# Patient Record
Sex: Female | Born: 1950 | Race: Black or African American | Hispanic: No | Marital: Single
Health system: Midwestern US, Academic
[De-identification: ages and names within clinical notes are randomized; demographics above are authoritative.]

---

## 2018-01-03 ENCOUNTER — Ambulatory Visit: Admit: 2018-01-03 | Discharge: 2018-01-04 | Payer: Medicare Other

## 2018-01-03 ENCOUNTER — Encounter: Admit: 2018-01-03 | Discharge: 2018-01-03 | Payer: Medicare Other

## 2018-01-03 MED ORDER — OXYCODONE-ACETAMINOPHEN 5-325 MG PO TAB
1 | ORAL_TABLET | ORAL | 0 refills | 2.00000 days | Status: AC | PRN
Start: 2018-01-03 — End: 2018-09-15

## 2018-01-04 DIAGNOSIS — T310 Burns involving less than 10% of body surface: ICD-10-CM

## 2018-01-04 DIAGNOSIS — T2020XA Burn of second degree of head, face, and neck, unspecified site, initial encounter: Principal | ICD-10-CM

## 2018-01-04 DIAGNOSIS — T2060XA Corrosion of second degree of head, face, and neck, unspecified site, initial encounter: ICD-10-CM

## 2018-01-11 ENCOUNTER — Encounter: Admit: 2018-01-11 | Discharge: 2018-01-11 | Payer: Medicare Other

## 2018-01-11 ENCOUNTER — Ambulatory Visit: Admit: 2018-01-11 | Discharge: 2018-01-12 | Payer: Medicare Other

## 2018-01-11 MED ORDER — COLLAGENASE CLOSTRIDIUM HISTO. 250 UNIT/GRAM TP OINT
Freq: Once | TOPICAL | 0 refills | Status: CP
Start: 2018-01-11 — End: ?
  Administered 2018-01-11: 17:00:00 via TOPICAL

## 2018-01-12 DIAGNOSIS — T2047XD Corrosion of unspecified degree of neck, subsequent encounter: Principal | ICD-10-CM

## 2018-01-12 DIAGNOSIS — T2040XD Corrosion of unspecified degree of head, face, and neck, unspecified site, subsequent encounter: Secondary | ICD-10-CM

## 2018-01-12 DIAGNOSIS — T2691XA Corrosion of right eye and adnexa, part unspecified, initial encounter: ICD-10-CM

## 2018-01-12 DIAGNOSIS — T543X1A Toxic effect of corrosive alkalis and alkali-like substances, accidental (unintentional), initial encounter: ICD-10-CM

## 2018-01-12 DIAGNOSIS — T2692XA Corrosion of left eye and adnexa, part unspecified, initial encounter: ICD-10-CM

## 2018-01-26 ENCOUNTER — Encounter: Admit: 2018-01-26 | Discharge: 2018-01-26 | Payer: Medicare Other

## 2018-01-26 ENCOUNTER — Ambulatory Visit: Admit: 2018-01-26 | Discharge: 2018-01-27 | Payer: Medicare Other

## 2018-01-27 DIAGNOSIS — T2040XD Corrosion of unspecified degree of head, face, and neck, unspecified site, subsequent encounter: Principal | ICD-10-CM

## 2018-02-10 ENCOUNTER — Encounter: Admit: 2018-02-10 | Discharge: 2018-02-10 | Payer: Medicare Other

## 2018-04-01 ENCOUNTER — Encounter: Admit: 2018-04-01 | Discharge: 2018-04-01

## 2018-04-01 ENCOUNTER — Emergency Department: Admit: 2018-04-01 | Discharge: 2018-04-01 | Disposition: A | Discharge status: Home / Self Care | Payer: MEDICARE

## 2018-04-01 ENCOUNTER — Encounter: Admit: 2018-04-01 | Discharge: 2018-04-01 | Payer: Medicare Other

## 2018-04-01 DIAGNOSIS — Z87891 Personal history of nicotine dependence: ICD-10-CM

## 2018-04-01 DIAGNOSIS — H1830 Unspecified corneal membrane change: Principal | ICD-10-CM

## 2018-04-01 MED ORDER — CYCLOPENTOLATE 1 % OP DROP
1 [drp] | Freq: Two times a day (BID) | OPHTHALMIC | 3 refills | 31.00000 days | Status: AC
Start: 2018-04-01 — End: ?
  Filled 2018-04-01 (×2): qty 15, 30d supply, fill #1

## 2018-04-01 MED ORDER — TOBRAMYCIN-DEXAMETHASONE 0.3-0.1 % OP OINT
OPHTHALMIC | 1 refills | 7.00000 days | Status: AC
Start: 2018-04-01 — End: 2018-04-27
  Filled 2018-04-01 (×2): qty 3, 7d supply, fill #1

## 2018-04-01 MED ORDER — TETRACAINE HCL (PF) 0.5 % OP DROP
1 [drp] | Freq: Once | OPHTHALMIC | 0 refills | Status: CP
Start: 2018-04-01 — End: ?
  Administered 2018-04-01: 19:00:00 1 [drp] via OPHTHALMIC

## 2018-04-01 MED ORDER — FLUORESCEIN 1 MG OP STRP
1 | Freq: Once | OPHTHALMIC | 0 refills | Status: CP
Start: 2018-04-01 — End: ?
  Administered 2018-04-01: 19:00:00 1 via OPHTHALMIC

## 2018-04-06 ENCOUNTER — Ambulatory Visit: Admit: 2018-04-06 | Discharge: 2018-04-07 | Payer: MEDICARE

## 2018-04-06 ENCOUNTER — Encounter: Admit: 2018-04-06 | Discharge: 2018-04-06 | Payer: Medicare Other

## 2018-04-06 DIAGNOSIS — H179 Unspecified corneal scar and opacity: ICD-10-CM

## 2018-04-06 MED ORDER — CYCLOSPORINE 0.05 % OP DPET
1 [drp] | Freq: Two times a day (BID) | OPHTHALMIC | 4 refills | 90.00000 days | Status: AC
Start: 2018-04-06 — End: 2018-05-04

## 2018-04-06 MED ORDER — TIMOLOL MALEATE 0.5 % OP DROP
1 [drp] | Freq: Every day | OPHTHALMIC | 3 refills | 100.00000 days | Status: AC
Start: 2018-04-06 — End: 2018-06-10

## 2018-04-07 DIAGNOSIS — T2692XA Corrosion of left eye and adnexa, part unspecified, initial encounter: ICD-10-CM

## 2018-04-07 DIAGNOSIS — H04123 Dry eye syndrome of bilateral lacrimal glands: ICD-10-CM

## 2018-04-07 DIAGNOSIS — T543X1A Toxic effect of corrosive alkalis and alkali-like substances, accidental (unintentional), initial encounter: Principal | ICD-10-CM

## 2018-04-27 ENCOUNTER — Encounter: Admit: 2018-04-27 | Discharge: 2018-04-27 | Payer: Medicare Other

## 2018-04-27 MED ORDER — TOBRAMYCIN-DEXAMETHASONE 0.3-0.1 % OP OINT
OPHTHALMIC | 1 refills | 7.00000 days | Status: AC
Start: 2018-04-27 — End: 2018-04-29

## 2018-04-29 ENCOUNTER — Encounter: Admit: 2018-04-29 | Discharge: 2018-04-29 | Payer: Medicare Other

## 2018-04-29 MED ORDER — TOBRAMYCIN-DEXAMETHASONE 0.3-0.1 % OP OINT
.5 [in_us] | Freq: Every evening | OPHTHALMIC | 1 refills | 7.00000 days | Status: AC
Start: 2018-04-29 — End: 2018-08-03

## 2018-05-04 ENCOUNTER — Ambulatory Visit: Admit: 2018-05-04 | Discharge: 2018-05-05 | Payer: MEDICARE

## 2018-05-04 ENCOUNTER — Encounter: Admit: 2018-05-04 | Discharge: 2018-05-04 | Payer: Medicare Other

## 2018-05-04 DIAGNOSIS — T543X1A Toxic effect of corrosive alkalis and alkali-like substances, accidental (unintentional), initial encounter: Principal | ICD-10-CM

## 2018-05-04 MED ORDER — CYCLOSPORINE 0.05 % OP DPET
1 [drp] | Freq: Two times a day (BID) | OPHTHALMIC | 4 refills | 90.00000 days | Status: AC
Start: 2018-05-04 — End: 2018-06-10

## 2018-05-25 ENCOUNTER — Encounter: Admit: 2018-05-25 | Discharge: 2018-05-25 | Payer: Medicare Other

## 2018-06-09 ENCOUNTER — Encounter: Admit: 2018-06-09 | Discharge: 2018-06-09 | Payer: Medicare Other

## 2018-06-10 ENCOUNTER — Encounter: Admit: 2018-06-10 | Discharge: 2018-06-10 | Payer: Medicare Other

## 2018-06-10 ENCOUNTER — Ambulatory Visit: Admit: 2018-06-10 | Discharge: 2018-06-11 | Payer: MEDICARE

## 2018-06-10 DIAGNOSIS — H25813 Combined forms of age-related cataract, bilateral: ICD-10-CM

## 2018-06-10 DIAGNOSIS — T543X1A Toxic effect of corrosive alkalis and alkali-like substances, accidental (unintentional), initial encounter: Principal | ICD-10-CM

## 2018-06-10 DIAGNOSIS — H04123 Dry eye syndrome of bilateral lacrimal glands: ICD-10-CM

## 2018-06-10 DIAGNOSIS — H179 Unspecified corneal scar and opacity: ICD-10-CM

## 2018-06-10 MED ORDER — TIMOLOL MALEATE 0.5 % OP DROP
Freq: Every morning | OPHTHALMIC | 2 refills | 100.00000 days | Status: AC
Start: 2018-06-10 — End: 2018-08-03

## 2018-06-10 MED ORDER — FLUOROMETHOLONE 0.1 % OP DRPS
1 [drp] | Freq: Two times a day (BID) | OPHTHALMIC | 3 refills | Status: AC
Start: 2018-06-10 — End: 2018-11-02

## 2018-06-22 MED ORDER — TIMOLOL MALEATE 0.5 % OP DROP
Freq: Every morning | OPHTHALMIC | 2 refills | 100.00000 days | Status: AC
Start: 2018-06-22 — End: ?

## 2018-06-27 ENCOUNTER — Encounter: Admit: 2018-06-27 | Discharge: 2018-06-28

## 2018-07-22 ENCOUNTER — Encounter: Admit: 2018-07-22 | Discharge: 2018-07-22 | Payer: Medicare Other

## 2018-07-27 ENCOUNTER — Encounter: Admit: 2018-07-27 | Discharge: 2018-07-27 | Payer: Medicare Other

## 2018-08-03 ENCOUNTER — Encounter: Admit: 2018-08-03 | Discharge: 2018-08-03 | Payer: Medicare Other

## 2018-08-03 ENCOUNTER — Ambulatory Visit: Admit: 2018-08-03 | Discharge: 2018-08-04 | Payer: MEDICARE

## 2018-08-03 DIAGNOSIS — T543X1A Toxic effect of corrosive alkalis and alkali-like substances, accidental (unintentional), initial encounter: ICD-10-CM

## 2018-08-03 DIAGNOSIS — H04123 Dry eye syndrome of bilateral lacrimal glands: Principal | ICD-10-CM

## 2018-08-03 MED ORDER — TIMOLOL MALEATE 0.5 % OP DROP
1 [drp] | Freq: Every day | OPHTHALMIC | 3 refills | 100.00000 days | Status: AC
Start: 2018-08-03 — End: 2018-11-02

## 2018-08-03 MED ORDER — PREDNISOLONE ACETATE 1 % OP DRPS
1 [drp] | Freq: Two times a day (BID) | OPHTHALMIC | 3 refills | 14.00000 days | Status: AC
Start: 2018-08-03 — End: 2018-11-02

## 2018-08-03 MED ORDER — TOBRAMYCIN-DEXAMETHASONE 0.3-0.1 % OP OINT
.5 [in_us] | Freq: Every evening | OPHTHALMIC | 1 refills | 7.00000 days | Status: AC
Start: 2018-08-03 — End: 2018-10-14

## 2018-08-15 ENCOUNTER — Encounter: Admit: 2018-08-15 | Discharge: 2018-08-15 | Payer: Medicare Other

## 2018-08-18 ENCOUNTER — Encounter: Admit: 2018-08-18 | Discharge: 2018-08-18 | Payer: Medicare Other

## 2018-08-23 ENCOUNTER — Encounter: Admit: 2018-08-23 | Discharge: 2018-08-23 | Payer: Medicare Other

## 2018-08-23 MED ORDER — CYCLOSPORINE 0.05 % OP DPET
1 [drp] | Freq: Two times a day (BID) | OPHTHALMIC | 11 refills | 90.00000 days | Status: AC
Start: 2018-08-23 — End: ?

## 2018-09-09 ENCOUNTER — Encounter: Admit: 2018-09-09 | Discharge: 2018-09-09 | Payer: Medicare Other

## 2018-09-15 ENCOUNTER — Ambulatory Visit: Admit: 2018-09-15 | Discharge: 2018-09-16 | Payer: Medicare Other

## 2018-09-15 ENCOUNTER — Encounter: Admit: 2018-09-15 | Discharge: 2018-09-15 | Payer: Medicare Other

## 2018-09-16 DIAGNOSIS — R221 Localized swelling, mass and lump, neck: Secondary | ICD-10-CM

## 2018-09-23 ENCOUNTER — Encounter: Admit: 2018-09-23 | Discharge: 2018-09-23 | Payer: Medicare Other

## 2018-10-14 ENCOUNTER — Encounter: Admit: 2018-10-14 | Discharge: 2018-10-14 | Payer: Medicare Other

## 2018-10-14 MED ORDER — TOBRADEX 0.3-0.1 % OP OINT
Freq: Every evening | 1 refills | 14.00000 days | Status: AC
Start: 2018-10-14 — End: 2018-11-02

## 2018-11-02 ENCOUNTER — Encounter: Admit: 2018-11-02 | Discharge: 2018-11-02 | Payer: Medicare Other

## 2018-11-02 ENCOUNTER — Ambulatory Visit: Admit: 2018-11-02 | Discharge: 2018-11-03 | Payer: Medicare Other

## 2018-11-02 DIAGNOSIS — T543X1A Toxic effect of corrosive alkalis and alkali-like substances, accidental (unintentional), initial encounter: Principal | ICD-10-CM

## 2018-11-02 MED ORDER — PREDNISOLONE ACETATE 1 % OP DRPS
1 [drp] | Freq: Two times a day (BID) | OPHTHALMIC | 3 refills | 5.00000 days | Status: AC
Start: 2018-11-02 — End: ?

## 2018-11-02 MED ORDER — TIMOLOL MALEATE 0.5 % OP DROP
1 [drp] | Freq: Every day | OPHTHALMIC | 3 refills | 100.00000 days | Status: AC
Start: 2018-11-02 — End: ?

## 2018-11-02 MED ORDER — TOBRADEX 0.3-0.1 % OP OINT
.25 [in_us] | Freq: Every evening | OPHTHALMIC | 1 refills | 7.00000 days | Status: AC | PRN
Start: 2018-11-02 — End: 2018-12-23

## 2018-11-03 ENCOUNTER — Encounter: Admit: 2018-11-03 | Discharge: 2018-11-03 | Payer: Medicare Other

## 2018-11-07 ENCOUNTER — Encounter: Admit: 2018-11-07 | Discharge: 2018-11-07 | Payer: Medicare Other

## 2018-11-07 ENCOUNTER — Emergency Department: Admit: 2018-11-07 | Discharge: 2018-11-07 | Disposition: A | Discharge status: Home / Self Care | Payer: Medicare Other

## 2018-11-07 DIAGNOSIS — D171 Benign lipomatous neoplasm of skin and subcutaneous tissue of trunk: ICD-10-CM

## 2018-11-07 DIAGNOSIS — H538 Other visual disturbances: Principal | ICD-10-CM

## 2018-11-07 LAB — POC GLUCOSE: Lab: 81 mg/dL (ref 70–100)

## 2018-11-07 MED ORDER — LIDOCAINE 5 % TP PTMD
1 | MEDICATED_PATCH | TOPICAL | 0 refills | 30.00000 days | Status: AC
Start: 2018-11-07 — End: ?

## 2018-11-07 MED ORDER — LIDOCAINE 5 % TP PTMD
1 | Freq: Once | TOPICAL | 0 refills | Status: DC
Start: 2018-11-07 — End: 2018-11-07
  Administered 2018-11-07: 20:00:00 1 via TOPICAL

## 2018-11-07 MED ORDER — PATCH DOCUMENTATION - LIDOCAINE 5%
Freq: Two times a day (BID) | TRANSDERMAL | 0 refills | Status: DC
Start: 2018-11-07 — End: 2018-11-07

## 2018-11-07 NOTE — ED Provider Notes
Pertinent medical/surgical history reviewed  No past medical history on file.  No past surgical history on file.    Social History:  Social History     Tobacco Use   ??? Smoking status: Former Smoker   ??? Smokeless tobacco: Never Used   Substance Use Topics   ??? Alcohol use: Not on file   ??? Drug use: Not on file     Social History     Substance and Sexual Activity   Drug Use Not on file       Family History:  Family History   Problem Relation Age of Onset   ??? Glaucoma Mother    ??? Blindness Neg Hx    ??? Macular Degen Neg Hx        Vitals:  ED Vitals    Date and Time T BP P RR SPO2P SPO2 User   11/07/18 1522 -- -- -- -- 72 100 % KC   11/07/18 1501 -- -- -- -- 76 100 % KC   11/07/18 1500 -- 110/83 -- -- -- -- KC   11/07/18 1446 -- -- -- -- 74 99 % KC   11/07/18 1432 37.1 ???C (98.7 ???F) -- -- -- 84 99 % KC   11/07/18 1430 -- 120/90 -- -- -- -- KC   11/07/18 1427 -- 124/91 -- -- -- -- KC          Physical Exam:  Physical Exam  Vitals signs and nursing note reviewed.   Constitutional:       Appearance: She is well-developed.   HENT:      Head: Normocephalic and atraumatic.      Right Ear: External ear normal.      Left Ear: External ear normal.      Nose: Nose normal.      Mouth/Throat:      Mouth: Mucous membranes are moist.   Eyes:      General: Lids are normal.      Intraocular pressure: Left eye pressure is 20 mmHg. Measurements were taken using a handheld tonometer.     Extraocular Movements: Extraocular movements intact.      Conjunctiva/sclera: Conjunctivae normal.     Neck:      Musculoskeletal: Neck supple.   Cardiovascular:      Rate and Rhythm: Normal rate and regular rhythm.      Pulses: Normal pulses.      Heart sounds: Normal heart sounds.   Pulmonary:      Effort: Pulmonary effort is normal.      Breath sounds: Normal breath sounds.   Abdominal:      General: Bowel sounds are normal.      Palpations: Abdomen is soft.      Tenderness: There is no abdominal tenderness.   Musculoskeletal: Normal range of motion.

## 2018-12-23 ENCOUNTER — Encounter: Admit: 2018-12-23 | Discharge: 2018-12-23 | Payer: Medicare Other

## 2018-12-23 MED ORDER — TOBRADEX 0.3-0.1 % OP OINT
.25 [in_us] | Freq: Every evening | OPHTHALMIC | 1 refills | 7.00000 days | Status: DC | PRN
Start: 2018-12-23 — End: 2018-12-28

## 2018-12-27 ENCOUNTER — Encounter: Admit: 2018-12-27 | Discharge: 2018-12-27 | Payer: Medicare Other

## 2018-12-28 MED ORDER — TOBRADEX 0.3-0.1 % OP OINT
Freq: Every evening | 1 refills | 14.00000 days | Status: AC
Start: 2018-12-28 — End: ?

## 2019-05-22 NOTE — Patient Instructions
Patient Information Following Procedures    After your procedure, your body will need time to heal. Things to know:    1. Rest today. No physical activity that will increase your blood pressure: heavy lifting, strenuous exercise.    2. A small amount of blood or drainage is normal.    3. Keep your incision dry for the first 24 hours. You may shower after 24 hours.    4. To promote optimal healing of the incision, eat a well balanced diet with lots of fluids. Avoid smoking for two weeks after surgery.    5. Notify your surgeon if the following occurs:   Fever   Increased swelling (there will be expected swelling for the first few days)   Foul smelling drainage   Increasing rather than decreasing pain   Redness or a hot feeling at the incision site   Any concerns you may have    [facility] [phone] DURING THE DAY OR [id number] AND ASKING FOR THE PLASTIC SURGERY RESIDENT ON CALL IF AFTER 5:00PM OR ON THE WEEKEND.

## 2019-05-22 NOTE — Progress Notes
Date of Service: 05/22/2019    Subjective:             Jill Weaver is a 68 y.o. female.    History of Present Illness        Review of Systems   Constitutional: Negative.    HENT: Negative.    Eyes: Negative.    Respiratory: Negative.    Cardiovascular: Negative.    Gastrointestinal: Negative.    Endocrine: Negative.    Genitourinary: Negative.    Musculoskeletal: Negative.    Skin: Negative.  Negative for color change.   Allergic/Immunologic: Negative.    Neurological: Negative.    Hematological: Negative.    Psychiatric/Behavioral: Negative.          Objective:         ? amLODIPine (NORVASC) 10 mg tablet Take 10 mg by mouth daily.   ? Artifi. Tear (Hypromellose)-PF 0.3 % drop Place  into or around eye(s).   ? atorvastatin (LIPITOR) 20 mg tablet Take 20 mg by mouth at bedtime daily.   ? cyclopentolate (CYCLODRYL) 1 % ophthalmic solution Apply one drop to left eye as directed twice daily.   ? cyclosporine (RESTASIS) 0.05 % ophthalmic emulsion Apply one drop to both eyes twice daily.   ? lidocaine (LIDODERM) 5 % topical patch Apply one patch topically to affected area every 24 hours. Apply patch for 12 hours, then remove for 12 hours before repeating.   ? lisinopril-hydrochlorothiazide (PRINZIDE, ZESTORETIC) 20-25 mg tablet Take 1 tablet by mouth daily.   ? petrolat,wht/min oil/sod chl (ARTIFICIAL TEARS OINTMENT OP)   1 APL, Each Affected Eye, Q2H, PRN for dry eyes, # 3.5 gm, 0 Refill(s), Pharmacy: [id number]   ? prednisolone acetate (PRED FORTE) 1 % ophthalmic suspension Apply one drop to left eye as directed twice daily.   ? PURALUBE 85-15 % oint Place 0.5 g into or around eye(s) at bedtime daily.   ? rosuvastatin (CRESTOR) 10 mg tablet Take 10 mg by mouth daily.   ? timolol maleate (TIMOPTIC) 0.5 % ophthalmic drops Apply one drop to left eye as directed daily.   ? timolol maleate (TIMOPTIC) 0.5 % ophthalmic drops INSTILL 1 DROP IN RIGHT EYE DAILY IN THE MORNING AS DIRECTED ? TOBRADEX 0.3-0.1 % ophthalmic ointment APPLY ONE-QUARTER INCH TO  LEFT EYE AT BEDTIME AS  NEEDED AS DIRECTED     Vitals:    05/22/19 0931   BP: 98/70   BP Source: Arm, Left Upper   Patient Position: Sitting   Pulse: 78   Temp: (!) 35.8 ?C (96.5 ?F)   Weight: 54.4 kg (120 lb)   Height: 172.7 cm (68)   PainSc: Zero     Body mass index is 18.25 kg/m?Marland Kitchen     Physical Exam         Assessment and Plan:     Problem   Mass of Soft Tissue of Neck           Procedure Time Out Check List:  Prior to the start of the procedure, I personally confirmed the following:    Site Marking Verified: Yes, as appropriate  Patient Identity (name & date of birth): Yes  Procedure: Yes  Site: Yes  Body Part: right posterior upper back/neck    The risks of the procedure, including infection, bleeding, pain and skin changes, were discussed with the patient.    Discussed excision of the lesion and potential complications: bleeding, infection, scarring, incomplete excision, and need for wider  margins (repeat excision) depending on the final pathology report. I marked the area with the patients help. The right upper back/neck  was prepped, injected with local, and draped. A 15 blade scalpel was used to do a  3 cm incision full thickness of skin.  The 4 cm subcutaneous fatty tumor was then dissected out sharply. The defect was  closed in 2 layers of Monocryl . Dermabond was then applied.

## 2019-06-13 ENCOUNTER — Encounter: Admit: 2019-06-13 | Discharge: 2019-06-13 | Payer: Medicare Other

## 2019-07-19 ENCOUNTER — Encounter: Admit: 2019-07-19 | Discharge: 2019-07-19 | Payer: Medicare Other

## 2019-07-19 NOTE — Telephone Encounter
Dwon Pearl come to the [facility] clinic to pick up patient's necklace on 12.2.2020.

## 2021-09-15 ENCOUNTER — Encounter: Admit: 2021-09-15 | Discharge: 2021-09-15 | Payer: Medicare Other

## 2021-11-19 ENCOUNTER — Encounter: Admit: 2021-11-19 | Discharge: 2021-11-19 | Payer: Medicare Other
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
## 2021-12-10 ENCOUNTER — Encounter: Admit: 2021-12-10 | Discharge: 2021-12-10 | Payer: Medicare Other

## 2021-12-10 ENCOUNTER — Ambulatory Visit: Admit: 2021-12-10 | Discharge: 2021-12-10 | Payer: Medicare Other

## 2021-12-10 DIAGNOSIS — E789 Disorder of lipoprotein metabolism, unspecified: Secondary | ICD-10-CM

## 2021-12-10 DIAGNOSIS — H269 Unspecified cataract: Secondary | ICD-10-CM

## 2021-12-10 DIAGNOSIS — H2513 Age-related nuclear cataract, bilateral: Secondary | ICD-10-CM

## 2021-12-10 DIAGNOSIS — I10 Essential (primary) hypertension: Secondary | ICD-10-CM

## 2021-12-10 DIAGNOSIS — T2692XA Corrosion of left eye and adnexa, part unspecified, initial encounter: Secondary | ICD-10-CM

## 2021-12-10 DIAGNOSIS — H179 Unspecified corneal scar and opacity: Secondary | ICD-10-CM

## 2021-12-10 NOTE — Telephone Encounter
Schedule pt for appt for may 12 at 10 am . Is still that is ok

## 2022-02-05 ENCOUNTER — Encounter: Admit: 2022-02-05 | Discharge: 2022-02-05 | Payer: Medicare Other

## 2022-07-16 ENCOUNTER — Encounter: Admit: 2022-07-16 | Discharge: 2022-07-16 | Payer: Medicare Other

## 2022-08-21 ENCOUNTER — Encounter: Admit: 2022-08-21 | Discharge: 2022-08-21 | Payer: Medicare Other

## 2022-08-21 DIAGNOSIS — H269 Unspecified cataract: Secondary | ICD-10-CM

## 2022-08-21 DIAGNOSIS — I10 Essential (primary) hypertension: Secondary | ICD-10-CM

## 2022-08-21 DIAGNOSIS — E789 Disorder of lipoprotein metabolism, unspecified: Secondary | ICD-10-CM
